# Patient Record
Sex: Female | Born: 1995 | Race: White | Hispanic: No | Marital: Single | Smoking: Current every day smoker
Health system: Southern US, Community
[De-identification: ages and names within clinical notes are randomized; demographics above are authoritative.]

## PROBLEM LIST (undated history)

## (undated) ENCOUNTER — Inpatient Hospital Stay: Payer: Self-pay

## (undated) HISTORY — PX: APPENDECTOMY: SHX54

---
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
## 2016-09-04 IMAGING — US US OB FOLLOW-UP
1 series · 13 of 28 positions shown · non-contrast
Comparison: none

CLINICAL DATA: 18-year-old at 21 weeks and 4 days by EDC of
09/09/2014. Evaluate anatomy.

EXAM:
OBSTETRIC 14+ WK ULTRASOUND FOLLOW-UP

[Series 1: us ob follow-up · 0.31mm/px · 13 of 48 slices shown]
[im 2/48]
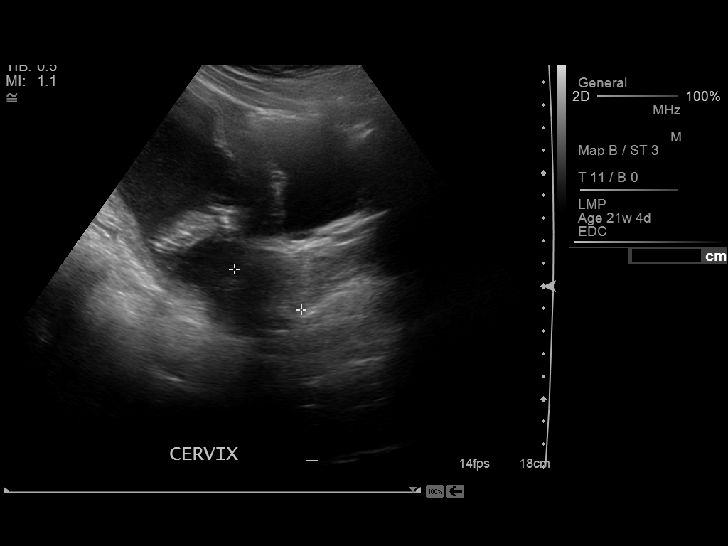
[im 6/48]
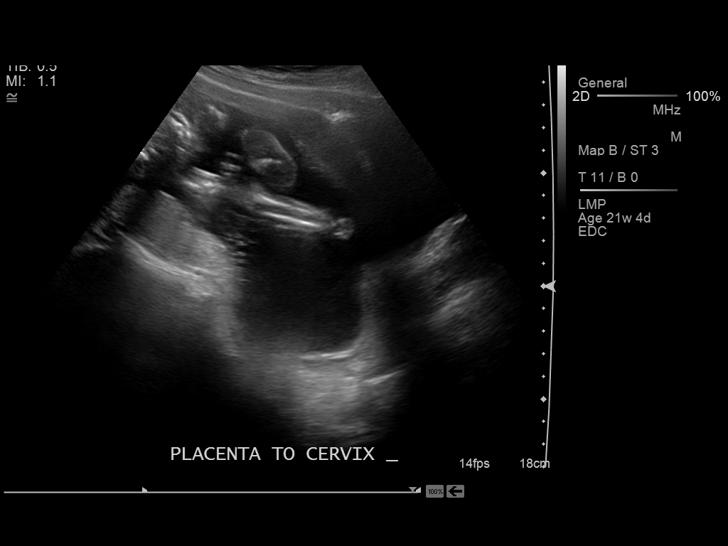
[im 9/48]
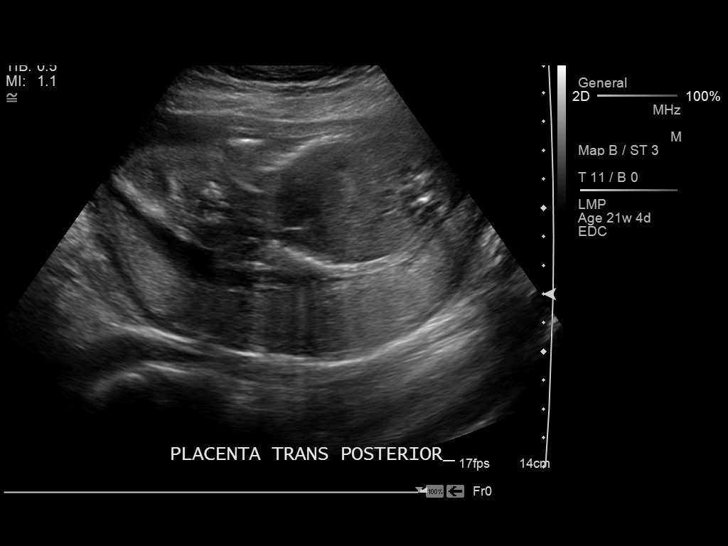
[im 13/48]
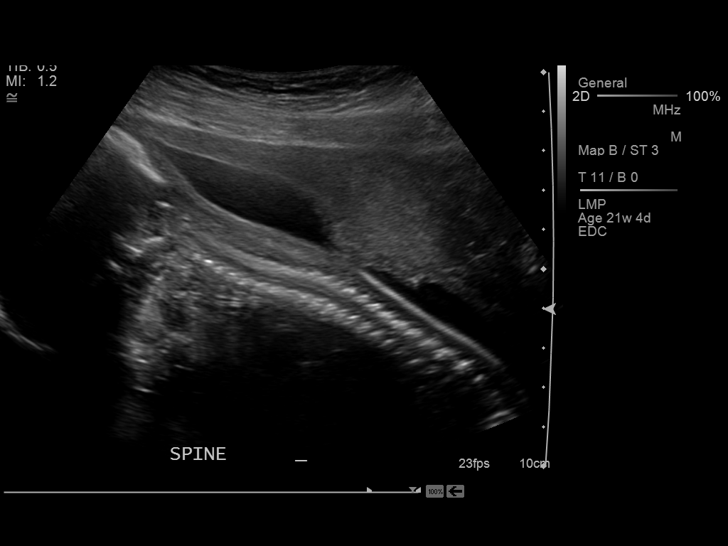
[im 16/48]
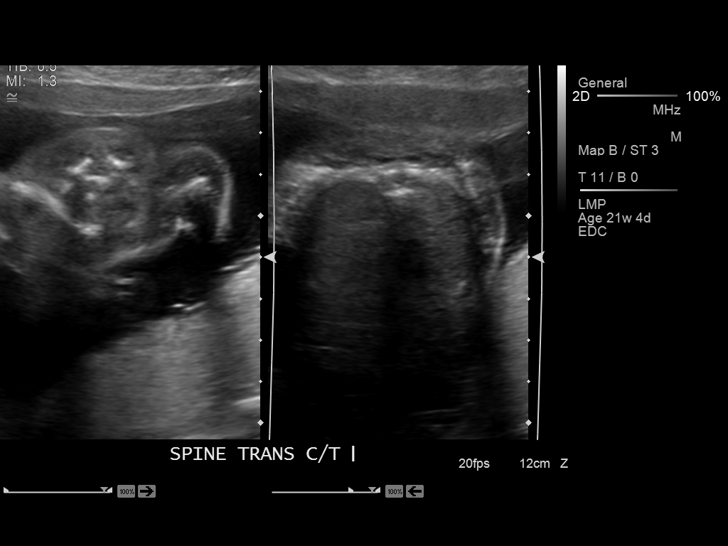
[im 20/48]
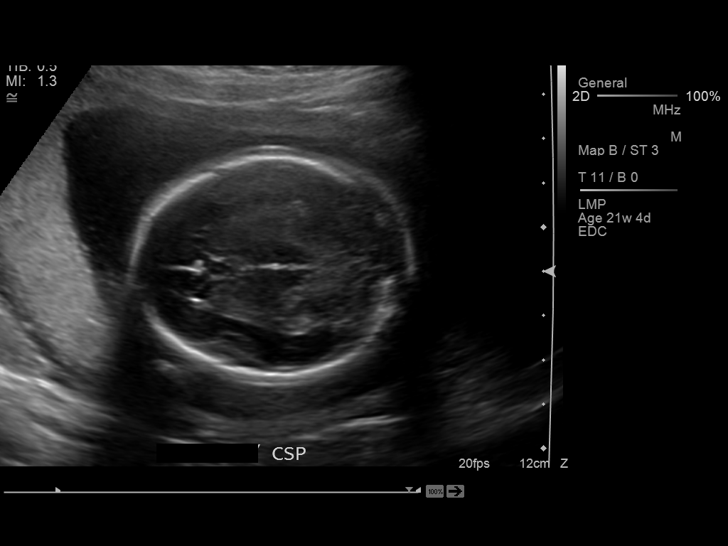
[im 25/48]
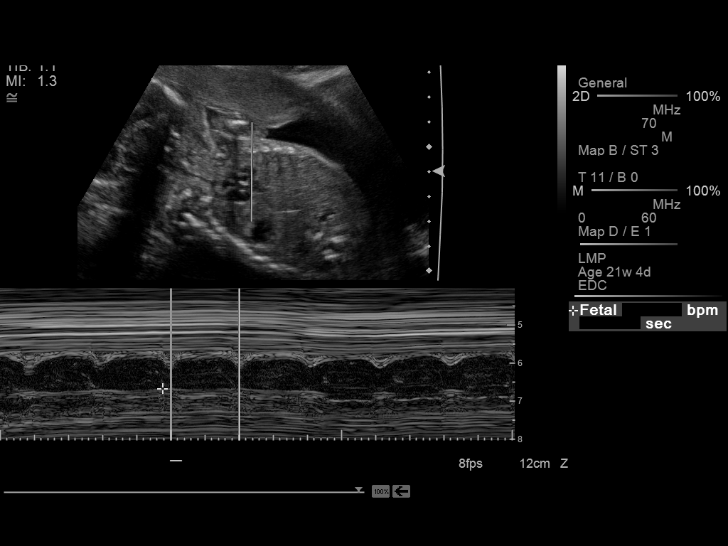
[im 28/48]
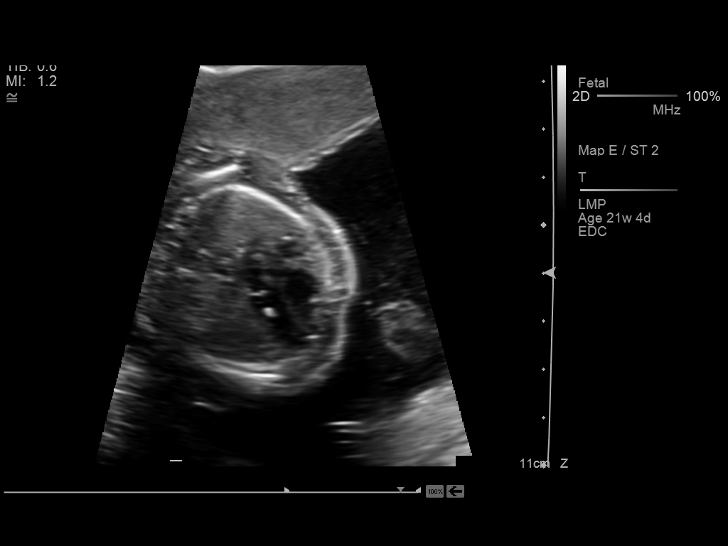
[im 32/48]
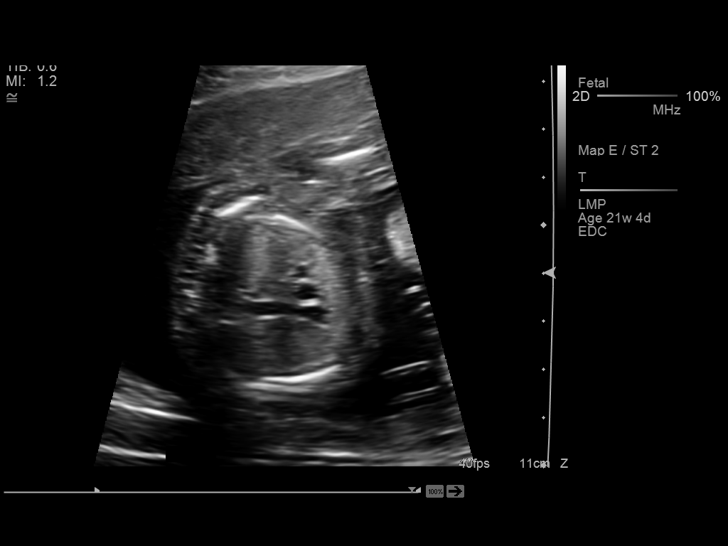
[im 35/48]
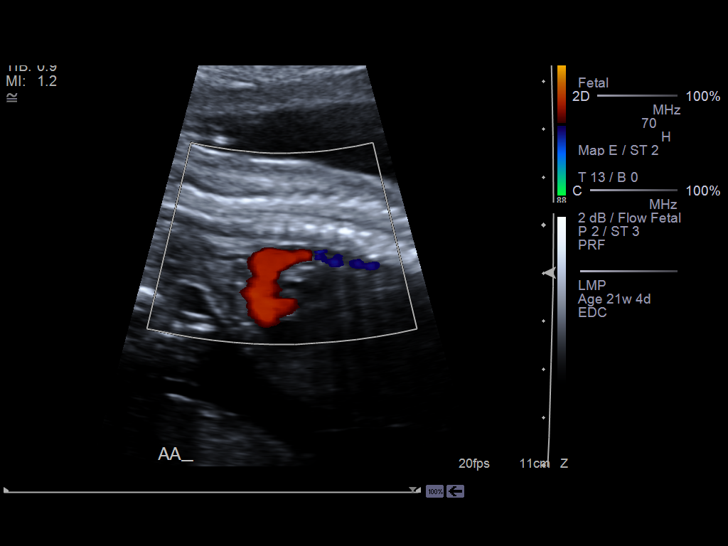
[im 39/48]
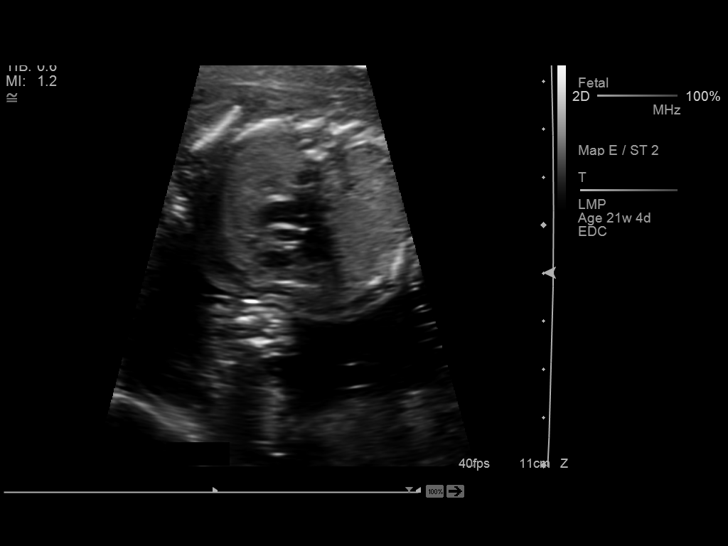
[im 42/48]
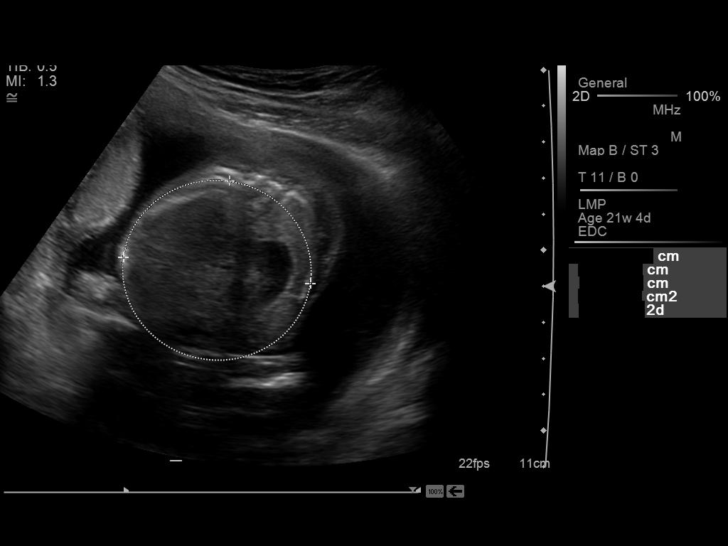
[im 46/48]
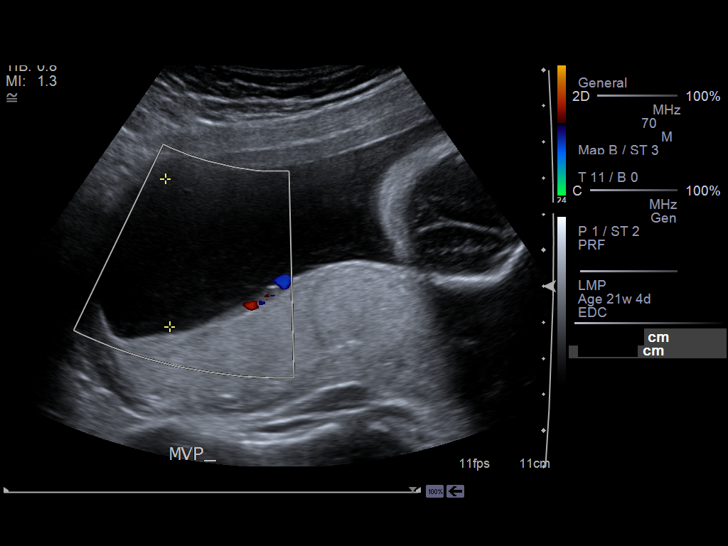

[13 of 28 positions shown; findings below may reference images not displayed]

FINDINGS: Number of Fetuses: 1

Heart Rate:  150 bpm

Movement: Yes

Presentation: Breech

Previa: No

Placental Location: Posterior

Amniotic Fluid (Subjective): Normal

Amniotic Fluid (Objective):

Vertical pocket 4.1cm

FETAL BIOMETRY

BPD:  4.88cm 20w   5d

HC:    18.28cm  20w   5d

AC:   15.67cm  20w   6d

FL:   3.422cm  20w   5d

Current Mean GA: 20w 6d          US EDC: 09/14/2014

Assigned GA:  21W 4d              Assigned EDC:  09/09/2014

FETAL ANATOMY

Lateral Ventricles: Visualized

Thalami/CSP: Visualized

Posterior Fossa:  Previously Visualized

Nuchal Region: Previously Visualized

Upper Lip: Previously Visualized

Spine: Visualized

4 Chamber Heart on Left: Visualized. Note is made of echogenic
intracardiac focus in the left ventricle.

LVOT: Visualized

RVOT: Visualized

Stomach on Left: Visualized

3 Vessel Cord: Previously visualized

Cord Insertion site: Previously visualized

Kidneys: Fistula

Bladder: Visual

Extremities: Previously visualized

Technically difficult due to: Fetal position

MATERNAL FINDINGS:

Cervix: 3.5 cm on transabdominal evaluation.
IMPRESSION: 1. Single living intrauterine fetus in breech presentation.
2. Size and dates correlate well.
3. There is an echogenic intracardiac focus in the left ventricle, a
soft marker for Down syndrome.
4. No other fetal anomalies are identified.

## 2016-10-29 ENCOUNTER — Ambulatory Visit
Admission: EM | Admit: 2016-10-29 | Discharge: 2016-10-29 | Disposition: A | Discharge status: Home / Self Care | Payer: Medicaid Other | Attending: Family Medicine | Admitting: Family Medicine

## 2016-10-29 ENCOUNTER — Encounter: Payer: Self-pay | Admitting: Emergency Medicine

## 2016-10-29 DIAGNOSIS — R109 Unspecified abdominal pain: Secondary | ICD-10-CM

## 2016-10-29 DIAGNOSIS — J020 Streptococcal pharyngitis: Secondary | ICD-10-CM

## 2016-10-29 LAB — RAPID STREP SCREEN (MED CTR MEBANE ONLY): Streptococcus, Group A Screen (Direct): POSITIVE — AB

## 2016-10-29 MED ORDER — PENICILLIN G BENZATHINE 1200000 UNIT/2ML IM SUSP
1.2000 10*6.[IU] | Freq: Once | INTRAMUSCULAR | Status: AC
  Administered 2016-10-29: 1.2 10*6.[IU] via INTRAMUSCULAR

## 2016-10-29 MED ORDER — ACETAMINOPHEN 500 MG PO TABS
1000.0000 mg | ORAL_TABLET | Freq: Once | ORAL | Status: AC
  Administered 2016-10-29: 1000 mg via ORAL

## 2016-10-29 MED ORDER — ACETAMINOPHEN 500 MG PO TABS: 500.0000 mg | ORAL_TABLET | Freq: Once | ORAL | Status: DC

## 2016-10-29 NOTE — ED Triage Notes (Signed)
Patient c/o sore throat, bodyaches, and stomach pain that started during last night.

## 2016-10-29 NOTE — ED Provider Notes (Addendum)
CSN: [id number]     Arrival date & time 10/29/16  1645 History   None    Chief Complaint  Patient presents with  . Generalized Body Aches  . Abdominal Pain  . Sore Throat   (Consider location/radiation/quality/duration/timing/severity/associated sxs/prior Treatment) HPI  This a 21 year old female who presents with sore throat body aches and stomach pain started last night. Patient states that she's had a sore throat. She had a cold with coughing a couple of weeks ago. She seemed to get over that except for some residual mucus that she would occasionally cough up. She has 2 children that [place] has [place] [employer] and also [place]. Neither of them are sick at this time. She has a fever of 101.4 pulse rate of 113 O2 sats of 100% blood pressure 126/90.       History reviewed. No pertinent past medical history. Past Surgical History:  Procedure Laterality Date  . APPENDECTOMY     Family History  Problem Relation Age of Onset  . Diabetes Mother   . Hepatitis C Mother   . Hepatitis C Father    Social History  Substance Use Topics  . Smoking status: Current Every Day Smoker  . Smokeless tobacco: Never Used  . Alcohol use No   OB History    Gravida Para Term Preterm AB Living   1 0       0   SAB TAB Ectopic Multiple Live Births                 Review of Systems  Constitutional: Positive for activity change, appetite change, chills, fatigue and fever.  HENT: Positive for sore throat.   Gastrointestinal: Positive for abdominal pain.  All other systems reviewed and are negative.   Allergies  Patient has no known allergies.  Home Medications   Prior to Admission medications   Not on File   Meds Ordered and Administered this Visit   Medications  acetaminophen (TYLENOL) tablet 1,000 mg (1,000 mg Oral Given 10/29/16 1727)  penicillin g benzathine (BICILLIN LA) 1200000 UNIT/2ML injection 1.2 Million Units (1.2 Million Units Intramuscular  Given 10/29/16 1819)    BP 126/90 (BP Location: Left Arm)   Pulse 85   Temp 99.4 F (37.4 C) (Oral)   Resp 16   Ht 5\' 1"  (1.549 m)   Wt 120 lb (54.4 kg)   LMP 10/15/2016 (Approximate)   SpO2 100%   BMI 22.67 kg/m  No data found.   Physical Exam  Constitutional: She is oriented to person, place, and time. She appears well-developed and well-nourished. No distress.  HENT:  Head: Normocephalic.  Right Ear: External ear normal.  Left Ear: External ear normal.  Mouth/Throat: No oropharyngeal exudate.  Examination of the right ear shows moderate burden of cerumen. The oropharynx shows the right tonsil to be inflamed swollen. There is a anterior cervical node palpable on the right. Left side appears normal. She has no exudate on the tonsils.  Eyes: Pupils are equal, round, and reactive to light. Right eye exhibits no discharge. Left eye exhibits no discharge.  Neck: Normal range of motion. No tracheal deviation present.  Pulmonary/Chest: Effort normal and breath sounds normal. No stridor.  Abdominal: Soft. Bowel sounds are normal. She exhibits no distension. There is tenderness. There is no rebound and no guarding.  Patient has mild diffuse tenderness of the abdomen.No Rebound no guarding present.  Musculoskeletal: Normal range of motion.  Lymphadenopathy:  She has cervical adenopathy.  Neurological: She is alert and oriented to person, place, and time.  Skin: Skin is warm and dry. She is not diaphoretic.  Psychiatric: She has a normal mood and affect. Her behavior is normal. Judgment and thought content normal.  Nursing note and vitals reviewed.   Urgent Care Course     Procedures (including critical care time)  Labs Review Labs Reviewed  RAPID STREP SCREEN (NOT AT [facility]) - Abnormal; Notable for the following:       Result Value   Streptococcus, Group A Screen (Direct) POSITIVE (*)    All other components within normal limits    Imaging Review No results  found.   Visual Acuity Review  Right Eye Distance:   Left Eye Distance:   Bilateral Distance:    Right Eye Near:   Left Eye Near:    Bilateral Near:     Medications  acetaminophen (TYLENOL) tablet 1,000 mg (1,000 mg Oral Given 10/29/16 1727)  penicillin g benzathine (BICILLIN LA) 1200000 UNIT/2ML injection 1.2 Million Units (1.2 Million Units Intramuscular Given 10/29/16 1819)      MDM   1. Strep pharyngitis    There are no discharge medications for this patient. Plan: 1. Test/x-ray results and diagnosis reviewed with patient 2. rx as per orders; risks, benefits, potential side effects reviewed with patient 3. Recommend supportive treatment with Rest and fluids. Be careful around the children until 24 hours after your medication injection.  If Your children shows signs / symptoms of illness take them to the pediatricians. Motrin as directed on bottle to help with fever chills and pain. If you not improving follow-up with her primary care physician. 4. F/u prn if symptoms worsen or don't improve     Lutricia FeilRoemer, Orly Quimby P, PA-C 10/29/16 1849    Lutricia FeilRoemer, Malayna Noori P, PA-C 10/29/16 1851

## 2016-11-27 ENCOUNTER — Ambulatory Visit
Admission: EM | Admit: 2016-11-27 | Discharge: 2016-11-27 | Disposition: A | Discharge status: Home / Self Care | Payer: Medicaid Other | Attending: Emergency Medicine | Admitting: Emergency Medicine

## 2016-11-27 ENCOUNTER — Encounter: Payer: Self-pay | Admitting: Emergency Medicine

## 2016-11-27 DIAGNOSIS — Z9109 Other allergy status, other than to drugs and biological substances: Secondary | ICD-10-CM | POA: Diagnosis not present

## 2016-11-27 DIAGNOSIS — F17210 Nicotine dependence, cigarettes, uncomplicated: Secondary | ICD-10-CM | POA: Insufficient documentation

## 2016-11-27 DIAGNOSIS — B9789 Other viral agents as the cause of diseases classified elsewhere: Secondary | ICD-10-CM

## 2016-11-27 DIAGNOSIS — J3489 Other specified disorders of nose and nasal sinuses: Secondary | ICD-10-CM | POA: Insufficient documentation

## 2016-11-27 DIAGNOSIS — R05 Cough: Secondary | ICD-10-CM | POA: Insufficient documentation

## 2016-11-27 DIAGNOSIS — Z79899 Other long term (current) drug therapy: Secondary | ICD-10-CM | POA: Insufficient documentation

## 2016-11-27 DIAGNOSIS — R079 Chest pain, unspecified: Secondary | ICD-10-CM | POA: Insufficient documentation

## 2016-11-27 DIAGNOSIS — J069 Acute upper respiratory infection, unspecified: Secondary | ICD-10-CM

## 2016-11-27 DIAGNOSIS — K219 Gastro-esophageal reflux disease without esophagitis: Secondary | ICD-10-CM | POA: Insufficient documentation

## 2016-11-27 DIAGNOSIS — D649 Anemia, unspecified: Secondary | ICD-10-CM | POA: Diagnosis not present

## 2016-11-27 DIAGNOSIS — R0981 Nasal congestion: Secondary | ICD-10-CM | POA: Diagnosis present

## 2016-11-27 DIAGNOSIS — R0982 Postnasal drip: Secondary | ICD-10-CM | POA: Diagnosis not present

## 2016-11-27 DIAGNOSIS — R062 Wheezing: Secondary | ICD-10-CM | POA: Insufficient documentation

## 2016-11-27 LAB — PREGNANCY, URINE: Preg Test, Ur: NEGATIVE

## 2016-11-27 MED ORDER — FLUTICASONE PROPIONATE 50 MCG/ACT NA SUSP: 2.0000 | Freq: Every day | NASAL | 0 refills | Status: DC

## 2016-11-27 MED ORDER — BENZONATATE 200 MG PO CAPS: 200.0000 mg | ORAL_CAPSULE | Freq: Three times a day (TID) | ORAL | 0 refills | Status: DC | PRN

## 2016-11-27 NOTE — ED Triage Notes (Signed)
Patient c/o cough and chest congestion that started yesterday.  Patient denies fevers.  

## 2016-11-27 NOTE — ED Provider Notes (Signed)
HPI  SUBJECTIVE:  Brittany Krueger is a 21 y.o. female who presents with greenish nasal congestion, rhinorrhea, postnasal drip starting 3-4 days ago. She reports sore, scratchy itchy throat that makes her cough and a cough productive of greenish mucus the same as her nasal congestion starting last night. She reports burning chest pain with the cough. She reports wheezing that clears with coughing, chest soreness. States that she was unable to sleep last night secondary to the cough. She reports sneezing, but no itchy, watery eyes. There are no aggravating or alleviating factors. She has not tried anything for this. Her father has identical symptoms currently and her 2 young children are sick as well. She denies ear pain, sinus pain and pressure, fevers, shortness of breath, shortness of breath with exertion. No antibiotics in the past month. No antipyretic in the past 6-8 hours. She is a smoker. She has a history of GERD, seasonal allergies, anemia. No history of asthma, emphysema, COPD, diabetes, hypertension. LMP: Regular. [id number], Adelina Mings, PA-C   History reviewed. No pertinent past medical history.  Past Surgical History:  Procedure Laterality Date  . APPENDECTOMY      Family History  Problem Relation Age of Onset  . Diabetes Mother   . Hepatitis C Mother   . Hepatitis C Father     Social History  Substance Use Topics  . Smoking status: Current Every Day Smoker    Types: Cigarettes  . Smokeless tobacco: Never Used  . Alcohol use No    No current facility-administered medications for this encounter.   Current Outpatient Prescriptions:  .  benzonatate (TESSALON) 200 MG capsule, Take 1 capsule (200 mg total) by mouth 3 (three) times daily as needed for cough., Disp: 30 capsule, Rfl: 0 .  fluticasone (FLONASE) 50 MCG/ACT nasal spray, Place 2 sprays into both nostrils daily., Disp: 16 g, Rfl: 0  No Known Allergies   ROS  As noted in HPI.   Physical Exam  BP 111/87 (BP Location:  Left Arm)   Pulse 93   Temp 98.5 F (36.9 C) (Oral)   Resp 14   Ht 5\' 1"  (1.549 m)   Wt 125 lb (56.7 kg)   SpO2 100%   BMI 23.62 kg/m   Constitutional: Well developed, well nourished, no acute distress Eyes:  EOMI, conjunctiva normal bilaterally HENT: Normocephalic, atraumatic,mucus membranes moistPositive clear rhinorrhea erythematous, swollen turbinates on the right more so than the left. No sinus tenderness. Normal oropharynx. No cobblestoning. Positive postnasal drip. Respiratory: Normal inspiratory effort lungs clear bilaterally, good air movement. No chest wall tenderness Cardiovascular: Normal rate regular rhythm no murmurs rubs gallops  GI: nondistended skin: No rash, skin intact Musculoskeletal: no deformities Neurologic: Alert & oriented x 3, no focal neuro deficits Psychiatric: Speech and behavior appropriate   ED Course   Medications - No data to display  Orders Placed This Encounter  Procedures  . Pregnancy, urine    Standing Status:   Standing    Number of Occurrences:   1    Results for orders placed or performed during the hospital encounter of 11/27/16 (from the past 24 hour(s))  Pregnancy, urine     Status: None   Collection Time: 11/27/16  5:35 PM  Result Value Ref Range   Preg Test, Ur NEGATIVE NEGATIVE   No results found.  ED Clinical Impression  Viral URI with cough   ED Assessment/Plan   Urine pregnancy negative.  Presentation consistent with viral URI. Feel of the cough is mostly  from postnasal drip. We'll send home with Tessalon, patient states that she does not want a prescription for cough syrup as it makes her vomit, Flonase, Mucinex D, advised saline nasal irrigation. Advised her that the cough may last for several weeks. She'll follow-up with her primary care physician as needed.   Meds ordered this encounter  Medications  . fluticasone (FLONASE) 50 MCG/ACT nasal spray    Sig: Place 2 sprays into both nostrils daily.     Dispense:  16 g    Refill:  0  . benzonatate (TESSALON) 200 MG capsule    Sig: Take 1 capsule (200 mg total) by mouth 3 (three) times daily as needed for cough.    Dispense:  30 capsule    Refill:  0    *This clinic note was created using Scientist, clinical (histocompatibility and immunogenetics)Dragon dictation software. Therefore, there may be occasional mistakes despite careful proofreading.  ?   Domenick GongMortenson, Kyliee Ortego, MD 11/27/16 1816

## 2016-11-27 NOTE — Discharge Instructions (Signed)
Tessalon for cough, saline nasal irrigation to help prevent sinus infection. use a Lloyd Hugereil med sinus rinse, flonase for nasal congestion. Start some mucinex D, The cough may last for several weeks, but you should be feeling better overall.  Give this a week to 10 days at least.

## 2017-01-13 ENCOUNTER — Emergency Department
Admission: EM | Admit: 2017-01-13 | Discharge: 2017-01-13 | Discharge status: Against Medical Advice | Payer: Medicaid Other | Attending: Emergency Medicine | Admitting: Emergency Medicine

## 2017-01-13 ENCOUNTER — Emergency Department: Payer: Medicaid Other

## 2017-01-13 DIAGNOSIS — F17210 Nicotine dependence, cigarettes, uncomplicated: Secondary | ICD-10-CM | POA: Diagnosis not present

## 2017-01-13 DIAGNOSIS — Z79899 Other long term (current) drug therapy: Secondary | ICD-10-CM | POA: Diagnosis not present

## 2017-01-13 DIAGNOSIS — O99331 Smoking (tobacco) complicating pregnancy, first trimester: Secondary | ICD-10-CM | POA: Diagnosis not present

## 2017-01-13 DIAGNOSIS — O26899 Other specified pregnancy related conditions, unspecified trimester: Secondary | ICD-10-CM

## 2017-01-13 DIAGNOSIS — R102 Pelvic and perineal pain: Secondary | ICD-10-CM | POA: Diagnosis not present

## 2017-01-13 DIAGNOSIS — R109 Unspecified abdominal pain: Secondary | ICD-10-CM

## 2017-01-13 DIAGNOSIS — O26891 Other specified pregnancy related conditions, first trimester: Secondary | ICD-10-CM | POA: Diagnosis present

## 2017-01-13 LAB — CBC
HEMATOCRIT: 38.7 % (ref 35.0–47.0)
HEMOGLOBIN: 13 g/dL (ref 12.0–16.0)
MCH: 29.3 pg (ref 26.0–34.0)
MCHC: 33.7 g/dL (ref 32.0–36.0)
MCV: 87.1 fL (ref 80.0–100.0)
Platelets: 206 10*3/uL (ref 150–440)
RBC: 4.44 MIL/uL (ref 3.80–5.20)
RDW: 13.9 % (ref 11.5–14.5)
WBC: 7.2 10*3/uL (ref 3.6–11.0)

## 2017-01-13 LAB — URINALYSIS, COMPLETE (UACMP) WITH MICROSCOPIC
Bilirubin Urine: NEGATIVE
Glucose, UA: NEGATIVE mg/dL
Hgb urine dipstick: NEGATIVE
Ketones, ur: NEGATIVE mg/dL
LEUKOCYTES UA: NEGATIVE
NITRITE: NEGATIVE
Protein, ur: NEGATIVE mg/dL
SPECIFIC GRAVITY, URINE: 1.019 (ref 1.005–1.030)
pH: 5 (ref 5.0–8.0)

## 2017-01-13 LAB — COMPREHENSIVE METABOLIC PANEL
ALBUMIN: 3.9 g/dL (ref 3.5–5.0)
ALK PHOS: 57 U/L (ref 38–126)
ALT: 8 U/L — ABNORMAL LOW (ref 14–54)
AST: 13 U/L — ABNORMAL LOW (ref 15–41)
Anion gap: 7 (ref 5–15)
BILIRUBIN TOTAL: 0.5 mg/dL (ref 0.3–1.2)
BUN: 11 mg/dL (ref 6–20)
CALCIUM: 8.9 mg/dL (ref 8.9–10.3)
CO2: 24 mmol/L (ref 22–32)
Chloride: 104 mmol/L (ref 101–111)
Creatinine, Ser: 0.81 mg/dL (ref 0.44–1.00)
GFR calc non Af Amer: >60 mL/min (ref >60)
GLUCOSE: 88 mg/dL (ref 65–99)
POTASSIUM: 4.1 mmol/L (ref 3.5–5.1)
SODIUM: 135 mmol/L (ref 135–145)
TOTAL PROTEIN: 7.8 g/dL (ref 6.5–8.1)

## 2017-01-13 LAB — CHLAMYDIA/NGC RT PCR (ARMC ONLY)
CHLAMYDIA TR: NOT DETECTED
N gonorrhoeae: NOT DETECTED

## 2017-01-13 LAB — WET PREP, GENITAL
Sperm: NONE SEEN
TRICH WET PREP: NONE SEEN
Yeast Wet Prep HPF POC: NONE SEEN

## 2017-01-13 LAB — POCT PREGNANCY, URINE: PREG TEST UR: POSITIVE — AB

## 2017-01-13 LAB — PREGNANCY, URINE: Preg Test, Ur: POSITIVE — AB

## 2017-01-13 LAB — LIPASE, BLOOD: Lipase: 32 U/L (ref 11–51)

## 2017-01-13 LAB — HCG, QUANTITATIVE, PREGNANCY: HCG, BETA CHAIN, QUANT, S: 11867 m[IU]/mL — AB (ref <5)

## 2017-01-13 MED ORDER — PRENATAL VITAMINS 0.8 MG PO TABS: 1.0000 | ORAL_TABLET | Freq: Every day | ORAL | 0 refills | Status: DC

## 2017-01-13 NOTE — ED Provider Notes (Addendum)
[facility] Emergency Department Provider Note  ____________________________________________   First MD Initiated Contact with Patient 01/13/17 2015     (approximate)  I have reviewed the triage vital signs and the nursing notes.   HISTORY  Chief Complaint Abdominal Pain   HPI Brittany Krueger is a 21 y.o. female presenting to the emergency department approximately [redacted] weeks pregnant with lower abdominal pain over the past 3 days. She denies any bleeding but does complain of discharge. Says that she is just had a child 11 months ago and has been on off of Depakote since then with bleeding issues and has had irregular periods. For this reason she has not sought treatment for not having a period over the last 3 months because of these irregular periods. However, she is concerned about drinking several times in the last several weeks and this causing pain to her lower abdomen as a friend has told her. She does not report any nausea or vomiting. Does not report any burning with urination or frequency.   No past medical history on file.  Patient Active Problem List   Diagnosis Date Noted  . Abdominal pain affecting pregnancy 08/02/2014    Past Surgical History:  Procedure Laterality Date  . APPENDECTOMY      Prior to Admission medications   Medication Sig Start Date End Date Taking? Authorizing Provider  benzonatate (TESSALON) 200 MG capsule Take 1 capsule (200 mg total) by mouth 3 (three) times daily as needed for cough. 11/27/16   Domenick Gong, MD  fluticasone (FLONASE) 50 MCG/ACT nasal spray Place 2 sprays into both nostrils daily. 11/27/16   Domenick Gong, MD    Allergies Patient has no known allergies.  Family History  Problem Relation Age of Onset  . Diabetes Mother   . Hepatitis C Mother   . Hepatitis C Father     Social History Social History  Substance Use Topics  . Smoking status: Current Every Day Smoker    Types: Cigarettes  .  Smokeless tobacco: Never Used  . Alcohol use No    Review of Systems  Constitutional: No fever/chills Eyes: No visual changes. ENT: No sore throat. Cardiovascular: Denies chest pain. Respiratory: Denies shortness of breath. Gastrointestinal:  No nausea, no vomiting.  No diarrhea.  No constipation. Genitourinary: Negative for dysuria. Musculoskeletal: mild left lower back pain. Skin: Negative for rash. Neurological: Negative for headaches, focal weakness or numbness.   ____________________________________________   PHYSICAL EXAM:  VITAL SIGNS: ED Triage Vitals  Enc Vitals Group     BP 01/13/17 1901 125/71     Pulse Rate 01/13/17 1901 89     Resp 01/13/17 1901 20     Temp 01/13/17 1901 98.5 F (36.9 C)     Temp Source 01/13/17 1901 Oral     SpO2 01/13/17 1901 100 %     Weight 01/13/17 1900 120 lb (54.4 kg)     Height 01/13/17 1900  (1.549 m)     Head Circumference --      Peak Flow --      Pain Score 01/13/17 1859 3     Pain Loc --      Pain Edu? --      Excl. in GC? --     Constitutional: Alert and oriented. Well appearing and in no acute distress. Eyes: Conjunctivae are normal.  Head: Atraumatic. Nose: No congestion/rhinnorhea. Mouth/Throat: Mucous membranes are moist.  Neck: No stridor.   Cardiovascular: Normal rate, regular rhythm. Grossly  normal heart sounds.   Respiratory: Normal respiratory effort.  No retractions. Lungs CTAB. Gastrointestinal: Soft with mild right lower quadrant as well as suprapubic tenderness with mild to moderate tenderness to palpation to left lower quadrant without rebound or guarding. No distention. No CVA tenderness. Genitourinary:  normal external appearance without any lesions. Speculum exam with a very small amount of white to clear mucus-like discharge. Bimanual exam without any CMT. The os is closed. No uterine nor adnexal tenderness nor masses palpable. Musculoskeletal: No lower extremity tenderness nor edema.  No joint  effusions. Neurologic:  Normal speech and language. No gross focal neurologic deficits are appreciated. Skin:  Skin is warm, dry and intact. No rash noted. Psychiatric: Mood and affect are normal. Speech and behavior are normal.  ____________________________________________   LABS (all labs ordered are listed, but only abnormal results are displayed)  Labs Reviewed  WET PREP, GENITAL - Abnormal; Notable for the following:       Result Value   Clue Cells Wet Prep HPF POC PRESENT (*)    WBC, Wet Prep HPF POC MODERATE (*)    All other components within normal limits  COMPREHENSIVE METABOLIC PANEL - Abnormal; Notable for the following:    AST 13 (*)    ALT 8 (*)    All other components within normal limits  URINALYSIS, COMPLETE (UACMP) WITH MICROSCOPIC - Abnormal; Notable for the following:    Color, Urine YELLOW (*)    APPearance CLEAR (*)    Bacteria, UA RARE (*)    Squamous Epithelial / LPF 0-5 (*)    All other components within normal limits  PREGNANCY, URINE - Abnormal; Notable for the following:    Preg Test, Ur POSITIVE (*)    All other components within normal limits  POCT PREGNANCY, URINE - Abnormal; Notable for the following:    Preg Test, Ur POSITIVE (*)    All other components within normal limits  CHLAMYDIA/NGC RT PCR ([facility] ONLY)  LIPASE, BLOOD  CBC  HCG, QUANTITATIVE, PREGNANCY  POC URINE PREG, ED   ____________________________________________  EKG   ____________________________________________  RADIOLOGY  subchronic hemorrhage. Possible early intrauterine gestational sac. ____________________________________________   PROCEDURES  Procedure(s) performed:   Procedures  Critical Care performed:   ____________________________________________   INITIAL IMPRESSION / ASSESSMENT AND PLAN / ED COURSE  Pertinent labs & imaging results that were available during my care of the patient were reviewed by me and considered in my medical decision  making (see chart for details).  Differential diagnosis includes, but is not limited to, threatened miscarriage, incomplete miscarriage, normal bleeding from an early trimester pregnancy, ectopic pregnancy, , blighted ovum, vaginal/cervical trauma, subchorionic hemorrhage/hematoma, etc.  As part of my medical decision making, I reviewed the following data within the electronic MEDICAL RECORD NUMBER Notes from prior ED visits  ----------------------------------------- 11:10 PM on 01/13/2017 -----------------------------------------  Patient wanting to leave prior to her hCG being resulted. Possible early gestational sac but without confirmed intrauterine pregnancy.right-sided cyst which is on the opposite side of her pain. I discussed the possible ramifications of leaving prior to the patient's full workup being completed. She understands the risks of ectopic pregnancy including death, surgery and serious medical complications. She has capacity and is not clinically intoxicated. She says she has to leave at this time. She'll be following up at the [facility] clinic in 2 days and can return to the emergency department at any time for any worsening or returning symptoms. She is understanding of this plan and  willing to comply.      ____________________________________________   FINAL CLINICAL IMPRESSION(S) / ED DIAGNOSES  Final diagnoses:  Abdominal pain during pregnancy, antepartum   pregnancy of unknown origin. Subchorionic hemorrhage. Abdominal pain of pregnancy.   NEW MEDICATIONS STARTED DURING THIS VISIT:  New Prescriptions   No medications on file     Note:  This document was prepared using Dragon voice recognition software and may include unintentional dictation errors.     Myrna Blazer, MD 01/13/17 2311  be positive blood type    Myrna Blazer, MD 01/13/17 [phone]

## 2017-01-13 NOTE — ED Notes (Signed)
Pt states she needs to leave. Pending HCG from lab.

## 2017-01-13 NOTE — ED Notes (Signed)
Called lab and notified of HCG add-on.

## 2017-01-13 NOTE — ED Notes (Signed)
MD speaking with pt regarding d/c prior to HCG levels resulting bc pt need to leave. D/c paperwork not typed at this time but verbal agreement with MD regarding followup.

## 2017-01-13 NOTE — ED Triage Notes (Signed)
Patient reports having irregular menstrual cycle since having baby 11 months ago, lmp 3 months ago.  Reports taking home pregnancy test with positive results.  Reports on Friday had a panic attack and since then has been having abdominal cramping.

## 2017-01-14 LAB — POCT PREGNANCY, URINE: PREG TEST UR: POSITIVE — AB

## 2017-03-05 ENCOUNTER — Other Ambulatory Visit: Payer: Self-pay

## 2017-03-05 ENCOUNTER — Ambulatory Visit
Admission: EM | Admit: 2017-03-05 | Discharge: 2017-03-05 | Disposition: A | Discharge status: Home / Self Care | Payer: Medicaid Other | Attending: Family Medicine | Admitting: Family Medicine

## 2017-03-05 ENCOUNTER — Encounter: Payer: Self-pay | Admitting: Emergency Medicine

## 2017-03-05 DIAGNOSIS — N939 Abnormal uterine and vaginal bleeding, unspecified: Secondary | ICD-10-CM

## 2017-03-05 DIAGNOSIS — Z91410 Personal history of adult physical and sexual abuse: Secondary | ICD-10-CM | POA: Diagnosis not present

## 2017-03-05 DIAGNOSIS — R6883 Chills (without fever): Secondary | ICD-10-CM | POA: Insufficient documentation

## 2017-03-05 DIAGNOSIS — J020 Streptococcal pharyngitis: Secondary | ICD-10-CM | POA: Insufficient documentation

## 2017-03-05 DIAGNOSIS — R52 Pain, unspecified: Secondary | ICD-10-CM | POA: Diagnosis present

## 2017-03-05 DIAGNOSIS — J029 Acute pharyngitis, unspecified: Secondary | ICD-10-CM | POA: Diagnosis present

## 2017-03-05 LAB — CBC WITH DIFFERENTIAL/PLATELET
BASOS PCT: 1 %
Basophils Absolute: 0.1 10*3/uL (ref 0–0.1)
Eosinophils Absolute: 0 10*3/uL (ref 0–0.7)
Eosinophils Relative: 0 %
HEMATOCRIT: 37.7 % (ref 35.0–47.0)
HEMOGLOBIN: 12.7 g/dL (ref 12.0–16.0)
Lymphocytes Relative: 7 %
Lymphs Abs: 0.8 10*3/uL — ABNORMAL LOW (ref 1.0–3.6)
MCH: 28.5 pg (ref 26.0–34.0)
MCHC: 33.6 g/dL (ref 32.0–36.0)
MCV: 85 fL (ref 80.0–100.0)
MONOS PCT: 8 %
Monocytes Absolute: 0.9 10*3/uL (ref 0.2–0.9)
NEUTROS ABS: 10 10*3/uL — AB (ref 1.4–6.5)
NEUTROS PCT: 84 %
Platelets: 225 10*3/uL (ref 150–440)
RBC: 4.44 MIL/uL (ref 3.80–5.20)
RDW: 13.2 % (ref 11.5–14.5)
WBC: 11.8 10*3/uL — ABNORMAL HIGH (ref 3.6–11.0)

## 2017-03-05 LAB — RAPID INFLUENZA A&B ANTIGENS (ARMC ONLY)
INFLUENZA A (ARMC): NEGATIVE
INFLUENZA B (ARMC): NEGATIVE

## 2017-03-05 LAB — RAPID STREP SCREEN (MED CTR MEBANE ONLY): STREPTOCOCCUS, GROUP A SCREEN (DIRECT): POSITIVE — AB

## 2017-03-05 MED ORDER — ACETAMINOPHEN 500 MG PO TABS
1000.0000 mg | ORAL_TABLET | Freq: Once | ORAL | Status: AC
  Administered 2017-03-05: 1000 mg via ORAL

## 2017-03-05 MED ORDER — PENICILLIN G BENZATHINE 1200000 UNIT/2ML IM SUSP
1.2000 10*6.[IU] | Freq: Once | INTRAMUSCULAR | Status: AC
  Administered 2017-03-05: 1.2 10*6.[IU] via INTRAMUSCULAR
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
## 2017-03-05 NOTE — ED Triage Notes (Signed)
Patient c/o sore throat, HAs, and nausea that started yesterday.

## 2017-03-05 NOTE — ED Notes (Signed)
Patient shows no signs of adverse reaction to medication at this time.  

## 2018-05-01 ENCOUNTER — Other Ambulatory Visit: Payer: Self-pay

## 2018-05-01 ENCOUNTER — Ambulatory Visit
Admission: EM | Admit: 2018-05-01 | Discharge: 2018-05-01 | Disposition: A | Discharge status: Home / Self Care | Payer: Medicaid Other | Attending: Family Medicine | Admitting: Family Medicine

## 2018-05-01 DIAGNOSIS — J020 Streptococcal pharyngitis: Secondary | ICD-10-CM

## 2018-05-01 LAB — RAPID STREP SCREEN (MED CTR MEBANE ONLY): STREPTOCOCCUS, GROUP A SCREEN (DIRECT): POSITIVE — AB

## 2018-05-01 MED ORDER — DEXAMETHASONE SODIUM PHOSPHATE 10 MG/ML IJ SOLN
10.0000 mg | Freq: Once | INTRAMUSCULAR | Status: AC
  Administered 2018-05-01: 10 mg via INTRAMUSCULAR

## 2018-05-01 MED ORDER — PENICILLIN G BENZATHINE 1200000 UNIT/2ML IM SUSP
1.2000 10*6.[IU] | Freq: Once | INTRAMUSCULAR | Status: AC
  Administered 2018-05-01: 1.2 10*6.[IU] via INTRAMUSCULAR

## 2018-05-01 NOTE — ED Provider Notes (Signed)
[facility]    CSN: [id number] Arrival date & time: 05/01/18  1619  History   Chief Complaint Chief Complaint  Patient presents with  . Sore Throat  . Otalgia   HPI  23 year old female presents with sore throat and ear pain.  3-day history of sore throat and ear pain.  Sore throat is severe.  No documented temperature at home.  She has an elevated temperature here.  Patient states that her sore throat is so severe that is interfering with her being able to swallow.  She has tried over-the-counter cold medication without resolution.  Sore throat exacerbated by swallowing.  No relieving factors.  No other associated symptoms.  No other complaints.  PMH, Surgical Hx, Family Hx, Social History reviewed and updated as below.  PMH:  Chronic shoulder pain Asthma  Past Surgical History:  Procedure Laterality Date  . APPENDECTOMY     OB History    Gravida  1   Para  0   Term      Preterm      AB      Living  0     SAB      TAB      Ectopic      Multiple      Live Births             Home Medications    Prior to Admission medications   Not on File   Family History Family History  Problem Relation Age of Onset  . Diabetes Mother   . Hepatitis C Mother   . Hepatitis C Father     Social History Social History   Tobacco Use  . Smoking status: Current Every Day Smoker    Packs/day: 0.50    Types: Cigarettes  . Smokeless tobacco: Never Used  Substance Use Topics  . Alcohol use: Yes    Comment: occasional  . Drug use: No     Allergies   Patient has no known allergies.   Review of Systems Review of Systems  Constitutional:       No fever at home.  HENT: Positive for ear pain, sore throat and trouble swallowing.    Physical Exam Triage Vital Signs ED Triage Vitals  Enc Vitals Group     BP 05/01/18 1642 119/65     Pulse Rate 05/01/18 1642 79     Resp 05/01/18 1642 16     Temp 05/01/18 1642 100 F (37.8 C)     Temp Source  05/01/18 1642 Oral     SpO2 05/01/18 1642 100 %     Weight 05/01/18 1643 135 lb (61.2 kg)     Height 05/01/18 1643 5\' 1"  (1.549 m)     Head Circumference --      Peak Flow --      Pain Score 05/01/18 1643 7     Pain Loc --      Pain Edu? --      Excl. in GC? --    Updated Vital Signs BP 119/65 (BP Location: Left Arm)   Pulse 79   Temp 100 F (37.8 C) (Oral)   Resp 16   Ht 5\' 1"  (1.549 m)   Wt 61.2 kg   LMP 04/20/2018   SpO2 100%   BMI 25.51 kg/m   Visual Acuity Right Eye Distance:   Left Eye Distance:   Bilateral Distance:    Right Eye Near:   Left Eye Near:    Bilateral Near:  Physical Exam Vitals signs and nursing note reviewed.  Constitutional:      General: She is not in acute distress.    Appearance: She is ill-appearing.  HENT:     Head: Normocephalic and atraumatic.     Right Ear: Tympanic membrane normal.     Left Ear: Tympanic membrane normal.     Mouth/Throat:     Pharynx: Uvula midline. Pharyngeal swelling and posterior oropharyngeal erythema present.     Tonsils: Swelling: 2+ on the right. 2+ on the left.  Eyes:     General:        Right eye: No discharge.        Left eye: No discharge.     Conjunctiva/sclera: Conjunctivae normal.  Cardiovascular:     Rate and Rhythm: Normal rate and regular rhythm.  Pulmonary:     Effort: Pulmonary effort is normal.     Breath sounds: Normal breath sounds.  Neurological:     Mental Status: She is alert.  Psychiatric:        Behavior: Behavior normal.     Comments: Flat affect.    UC Treatments / Results  Labs (all labs ordered are listed, but only abnormal results are displayed) Labs Reviewed  RAPID STREP SCREEN ([facility] ONLY) - Abnormal; Notable for the following components:      Result Value   Streptococcus, Group A Screen (Direct) POSITIVE (*)    All other components within normal limits    EKG None  Radiology No results found.  Procedures Procedures (including critical care  time)  Medications Ordered in UC Medications  penicillin g benzathine (BICILLIN LA) 1200000 UNIT/2ML injection 1.2 Million Units (1.2 Million Units Intramuscular Given 05/01/18 1721)  dexamethasone (DECADRON) injection 10 mg (10 mg Intramuscular Given 05/01/18 1721)    Initial Impression / Assessment and Plan / UC Course  I have reviewed the triage vital signs and the nursing notes.  Pertinent labs & imaging results that were available during my care of the patient were reviewed by me and considered in my medical decision making (see chart for details).    23 year old female presents with strep pharyngitis.  Patient very symptomatic and feels poorly.  IM injection of penicillin given.  Patient also elected to have an injection of corticosteroid to help with the pain.  Final Clinical Impressions(s) / UC Diagnoses   Final diagnoses:  Strep pharyngitis   Discharge Instructions   None    ED Prescriptions    None     Controlled Substance Prescriptions [place] Controlled Substance Registry consulted? Not Applicable   Tommie Sams, DO 05/01/18 1806

## 2018-05-01 NOTE — ED Triage Notes (Signed)
Pt with sore throat, bilateral otalgia, low grade fever

## 2018-09-15 ENCOUNTER — Other Ambulatory Visit: Payer: Self-pay

## 2018-09-15 ENCOUNTER — Encounter: Payer: Self-pay | Admitting: Emergency Medicine

## 2018-09-15 DIAGNOSIS — S0990XA Unspecified injury of head, initial encounter: Secondary | ICD-10-CM | POA: Insufficient documentation

## 2018-09-15 DIAGNOSIS — F17210 Nicotine dependence, cigarettes, uncomplicated: Secondary | ICD-10-CM | POA: Insufficient documentation

## 2018-09-15 DIAGNOSIS — Y999 Unspecified external cause status: Secondary | ICD-10-CM | POA: Insufficient documentation

## 2018-09-15 DIAGNOSIS — S060X0A Concussion without loss of consciousness, initial encounter: Secondary | ICD-10-CM | POA: Insufficient documentation

## 2018-09-15 DIAGNOSIS — Y9389 Activity, other specified: Secondary | ICD-10-CM | POA: Insufficient documentation

## 2018-09-15 DIAGNOSIS — R55 Syncope and collapse: Secondary | ICD-10-CM | POA: Insufficient documentation

## 2018-09-15 DIAGNOSIS — R531 Weakness: Secondary | ICD-10-CM | POA: Insufficient documentation

## 2018-09-15 DIAGNOSIS — Y92410 Unspecified street and highway as the place of occurrence of the external cause: Secondary | ICD-10-CM | POA: Insufficient documentation
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
## 2018-09-16 ENCOUNTER — Emergency Department: Payer: Self-pay

## 2018-09-16 ENCOUNTER — Emergency Department
Admission: EM | Admit: 2018-09-16 | Discharge: 2018-09-16 | Disposition: A | Discharge status: Home / Self Care | Payer: Self-pay | Attending: Emergency Medicine | Admitting: Emergency Medicine

## 2018-09-16 DIAGNOSIS — S060X0A Concussion without loss of consciousness, initial encounter: Secondary | ICD-10-CM

## 2018-09-16 DIAGNOSIS — R55 Syncope and collapse: Secondary | ICD-10-CM

## 2018-09-16 LAB — COMPREHENSIVE METABOLIC PANEL
ALT: 10 U/L (ref 0–44)
AST: 14 U/L — ABNORMAL LOW (ref 15–41)
Albumin: 4.5 g/dL (ref 3.5–5.0)
Alkaline Phosphatase: 54 U/L (ref 38–126)
Anion gap: 9 (ref 5–15)
BUN: 11 mg/dL (ref 6–20)
CO2: 23 mmol/L (ref 22–32)
Calcium: 9.3 mg/dL (ref 8.9–10.3)
Chloride: 106 mmol/L (ref 98–111)
Creatinine, Ser: 0.67 mg/dL (ref 0.44–1.00)
GFR calc Af Amer: >60 mL/min (ref >60)
GFR calc non Af Amer: >60 mL/min (ref >60)
Glucose, Bld: 103 mg/dL — ABNORMAL HIGH (ref 70–99)
Potassium: 3.6 mmol/L (ref 3.5–5.1)
Sodium: 138 mmol/L (ref 135–145)
Total Bilirubin: 1.1 mg/dL (ref 0.3–1.2)
Total Protein: 8.2 g/dL — ABNORMAL HIGH (ref 6.5–8.1)

## 2018-09-16 LAB — URINALYSIS, ROUTINE W REFLEX MICROSCOPIC
Bacteria, UA: NONE SEEN
Bilirubin Urine: NEGATIVE
Glucose, UA: NEGATIVE mg/dL
Ketones, ur: NEGATIVE mg/dL
Leukocytes,Ua: NEGATIVE
Nitrite: NEGATIVE
Protein, ur: NEGATIVE mg/dL
Specific Gravity, Urine: 1.02 (ref 1.005–1.030)
pH: 6 (ref 5.0–8.0)

## 2018-09-16 LAB — CBC
HCT: 42.8 % (ref 36.0–46.0)
Hemoglobin: 14 g/dL (ref 12.0–15.0)
MCH: 29.5 pg (ref 26.0–34.0)
MCHC: 32.7 g/dL (ref 30.0–36.0)
MCV: 90.3 fL (ref 80.0–100.0)
Platelets: 245 10*3/uL (ref 150–400)
RBC: 4.74 MIL/uL (ref 3.87–5.11)
RDW: 13.2 % (ref 11.5–15.5)
WBC: 9.1 10*3/uL (ref 4.0–10.5)
nRBC: 0 % (ref 0.0–0.2)

## 2018-09-16 LAB — POCT PREGNANCY, URINE: Preg Test, Ur: NEGATIVE

## 2018-09-16 LAB — TROPONIN I: Troponin I: <0.03 ng/mL (ref <0.03)

## 2018-09-16 MED ORDER — SODIUM CHLORIDE 0.9 % IV BOLUS
1000.0000 mL | Freq: Once | INTRAVENOUS | Status: AC
  Administered 2018-09-16: 1000 mL via INTRAVENOUS

## 2018-09-16 MED ORDER — KETOROLAC TROMETHAMINE 30 MG/ML IJ SOLN
15.0000 mg | Freq: Once | INTRAMUSCULAR | Status: AC
  Administered 2018-09-16: 15 mg via INTRAVENOUS
  Filled 2018-09-16: qty 1

## 2018-09-16 MED ORDER — ONDANSETRON HCL 4 MG/2ML IJ SOLN
4.0000 mg | Freq: Once | INTRAMUSCULAR | Status: AC
  Administered 2018-09-16: 4 mg via INTRAVENOUS
  Filled 2018-09-16: qty 2
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
## 2018-09-16 NOTE — ED Notes (Signed)
EDP in with patient 

## 2018-10-05 ENCOUNTER — Other Ambulatory Visit: Payer: Self-pay

## 2018-10-05 ENCOUNTER — Ambulatory Visit
Admission: EM | Admit: 2018-10-05 | Discharge: 2018-10-05 | Disposition: A | Discharge status: Home / Self Care | Payer: MEDICAID | Attending: Family Medicine | Admitting: Family Medicine

## 2018-10-05 DIAGNOSIS — Z202 Contact with and (suspected) exposure to infections with a predominantly sexual mode of transmission: Secondary | ICD-10-CM

## 2018-10-05 DIAGNOSIS — N760 Acute vaginitis: Secondary | ICD-10-CM

## 2018-10-05 DIAGNOSIS — B9689 Other specified bacterial agents as the cause of diseases classified elsewhere: Secondary | ICD-10-CM

## 2018-10-05 DIAGNOSIS — Z3202 Encounter for pregnancy test, result negative: Secondary | ICD-10-CM

## 2018-10-05 DIAGNOSIS — N898 Other specified noninflammatory disorders of vagina: Secondary | ICD-10-CM

## 2018-10-05 LAB — WET PREP, GENITAL
Sperm: NONE SEEN
Trich, Wet Prep: NONE SEEN
Yeast Wet Prep HPF POC: NONE SEEN

## 2018-10-05 LAB — URINALYSIS, COMPLETE (UACMP) WITH MICROSCOPIC
Bilirubin Urine: NEGATIVE
Glucose, UA: NEGATIVE mg/dL
Hgb urine dipstick: NEGATIVE
Leukocytes,Ua: NEGATIVE
Nitrite: NEGATIVE
Protein, ur: NEGATIVE mg/dL
Specific Gravity, Urine: >1.03 — ABNORMAL HIGH (ref 1.005–1.030)
pH: 6.5 (ref 5.0–8.0)

## 2018-10-05 LAB — CHLAMYDIA/NGC RT PCR (ARMC ONLY): N gonorrhoeae: NOT DETECTED

## 2018-10-05 LAB — CHLAMYDIA/NGC RT PCR (ARMC ONLY)??????????: Chlamydia Tr: NOT DETECTED

## 2018-10-05 LAB — PREGNANCY, URINE: Preg Test, Ur: NEGATIVE

## 2018-10-05 MED ORDER — METRONIDAZOLE 500 MG PO TABS: 500.0000 mg | ORAL_TABLET | Freq: Two times a day (BID) | ORAL | 0 refills | Status: AC
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
## 2018-10-05 NOTE — Discharge Instructions (Addendum)
It was very nice seeing you today in clinic. Thank you for entrusting me with your care.   Labs obtained today in clinic to test for STDs. We will have someone call you with the results.   What has resulted so far was positive for BV. I have given you some information on that. You cannot drink alcohol with the medication used to treat this as it will make you very sick.   Make arrangements to follow up with your regular doctor in 1 week for re-evaluation if not improving. If your symptoms/condition worsens, please seek follow up care either here or in the ER. Please remember, our [place] providers are "right here with you" when you need [place].   Again, it was my pleasure to take care of you today. Thank you for choosing our clinic. I hope that you start to feel better quickly.   Honor Loh, MSN, APRN, FNP-C, CEN Advanced Practice Provider [facility]

## 2018-10-06 LAB — HIV ANTIBODY (ROUTINE TESTING W REFLEX): HIV Screen 4th Generation wRfx: NONREACTIVE

## 2018-10-06 LAB — RPR: RPR Ser Ql: NONREACTIVE

## 2018-10-07 LAB — HEPATITIS PANEL, ACUTE
HCV Ab: 0.2 s/co ratio (ref 0.0–0.9)
Hep A IgM: NEGATIVE
Hep B C IgM: NEGATIVE
Hepatitis B Surface Ag: NEGATIVE

## 2021-01-18 IMAGING — CT CT HEAD WITHOUT CONTRAST
4 of 5 series · 15 of 47 positions shown, 17 images · non-contrast
Comparison: None.

CLINICAL DATA: Headache

EXAM:
CT HEAD WITHOUT CONTRAST
TECHNIQUE: Contiguous axial images were obtained from the base of the skull
through the vertex without intravenous contrast.

[Series 2: head wo · axial · 0.41mm/px · z∈[-68,+12]mm · 4 of 28 slices shown]
[im 6/28  brain]
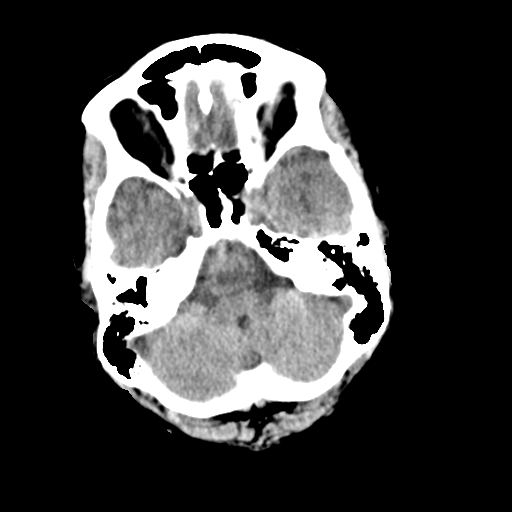
[im 11/28  brain]
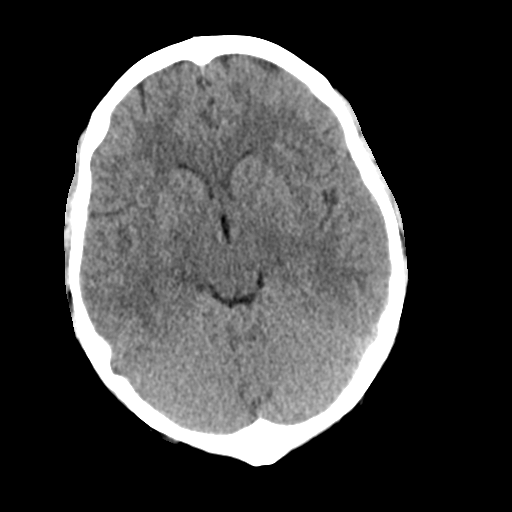
[im 17/28  brain]
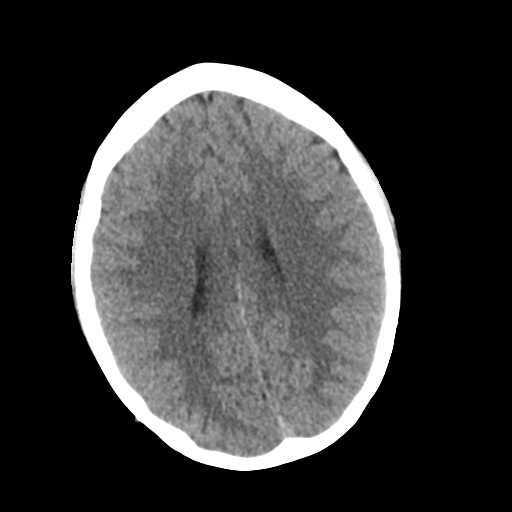
[im 22/28  brain]
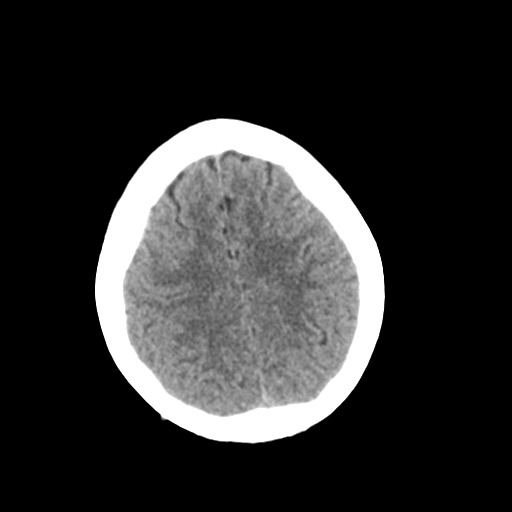

[Series 4: head wo recon · axial · 0.32mm/px · z∈[-117,-18]mm · 5 of 32 slices shown, 7 images]
[im 6/32  brain]
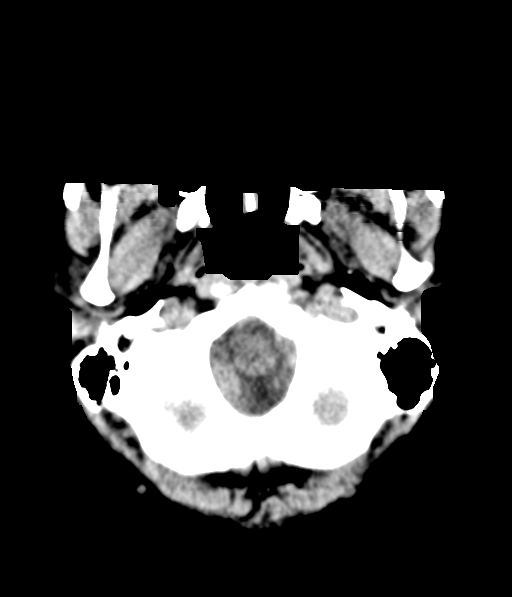
[im 6/32  bone]
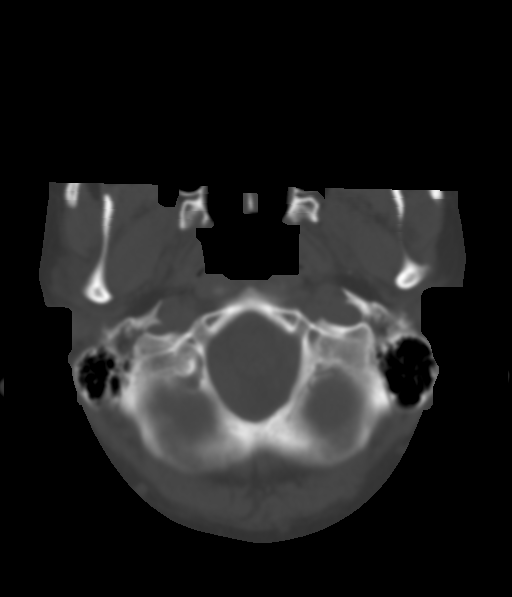
[im 11/32  brain]
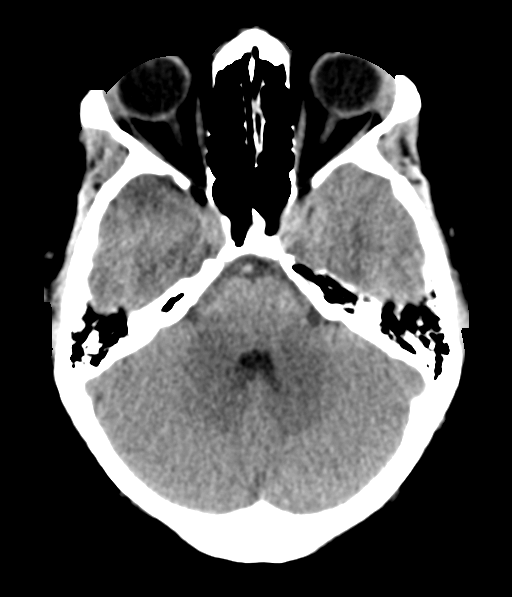
[im 16/32  brain]
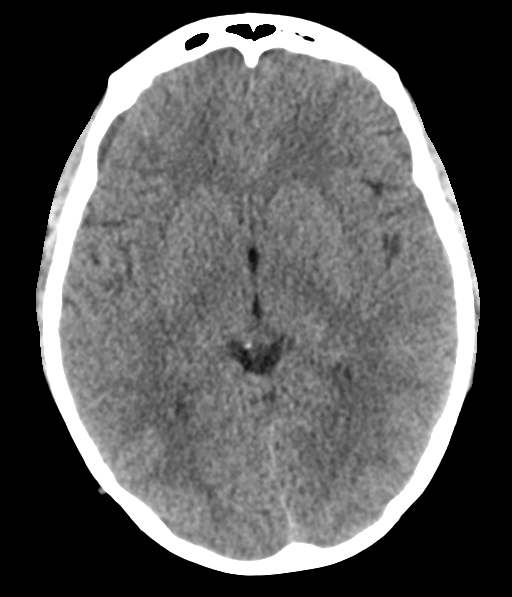
[im 21/32  brain]
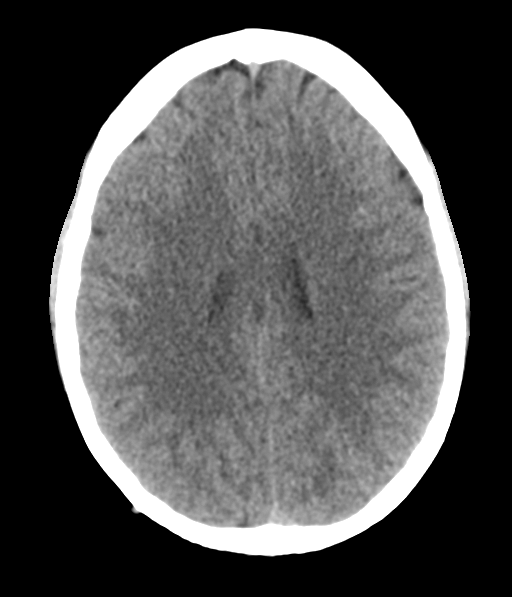
[im 26/32  brain]
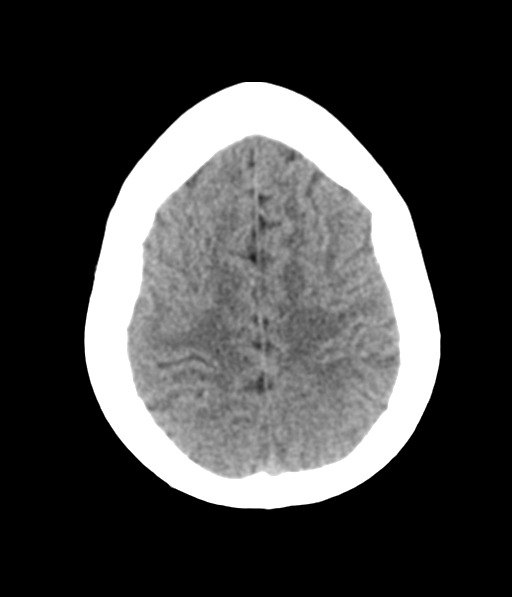
[im 26/32  bone]
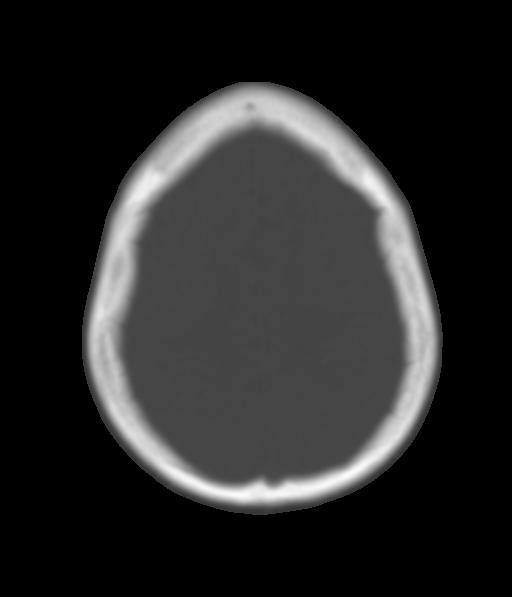

[Series 6: coronal soft tissue · coronal · 0.30mm/px · 3 of 67 slices shown]
[im 23/67  brain]
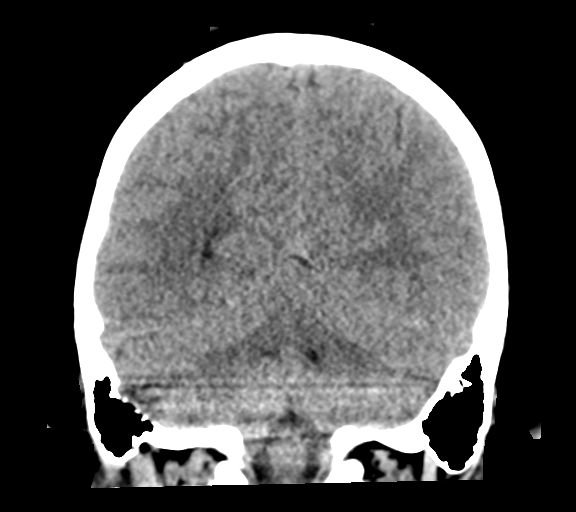
[im 30/67  brain]
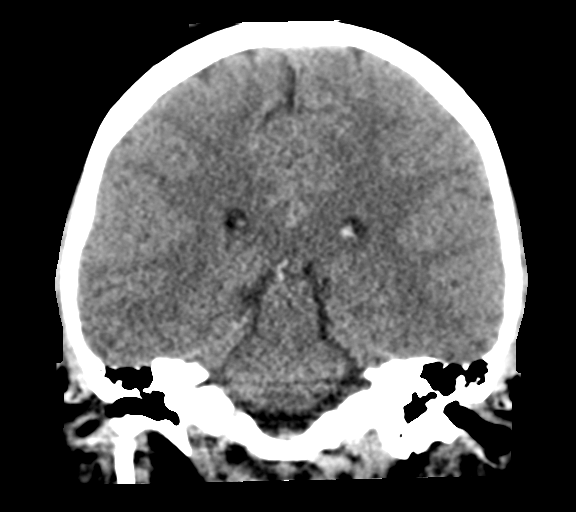
[im 37/67  brain]
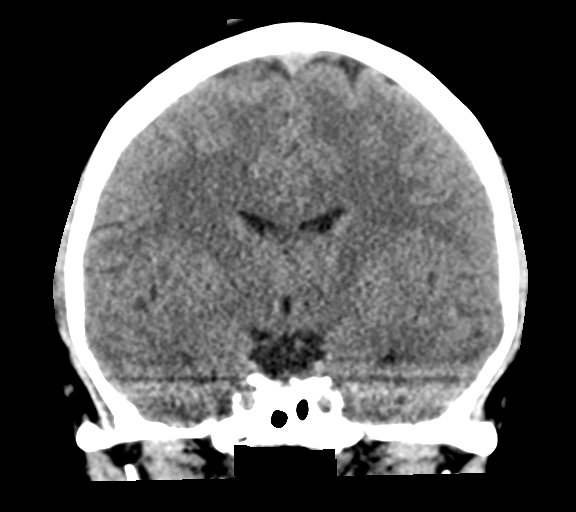

[Series 7: sagittal soft tissue · sagittal · 0.31mm/px · 3 of 67 slices shown]
[im 23/67  brain]
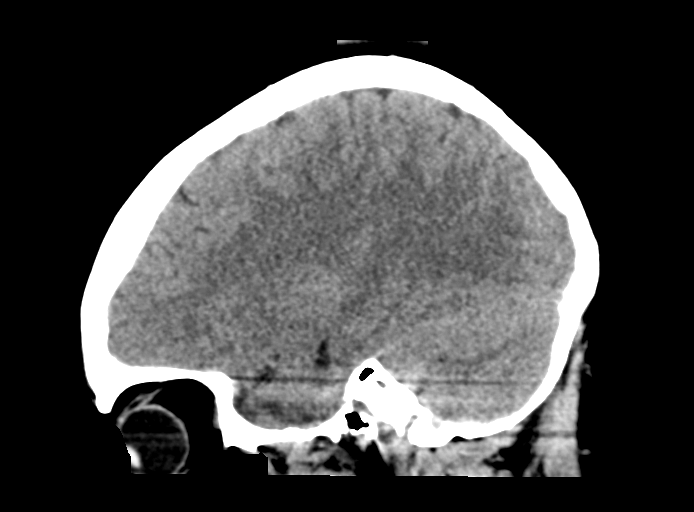
[im 34/67  brain]
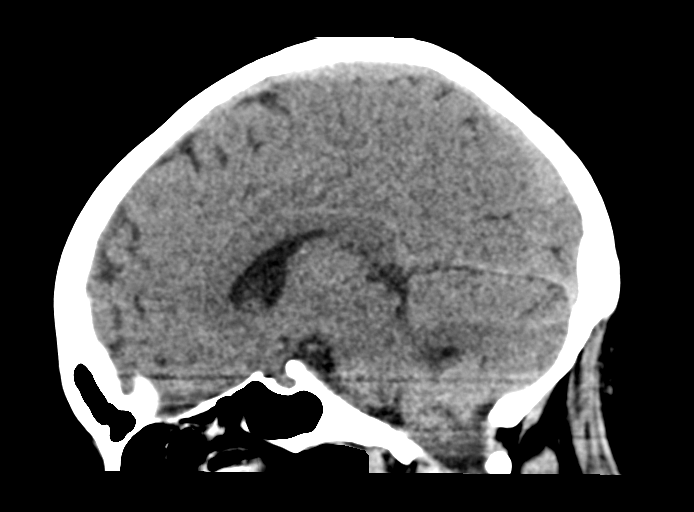
[im 45/67  brain]
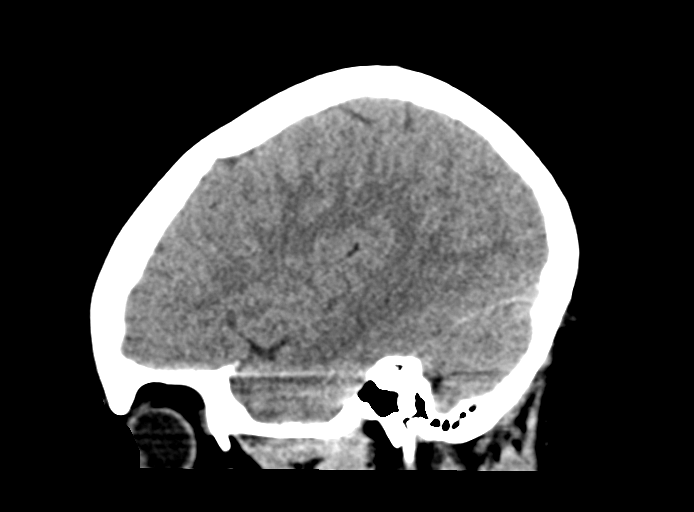

[15 of 47 positions shown; findings below may reference images not displayed]

FINDINGS: Brain: No acute intracranial abnormality. Specifically, no
hemorrhage, hydrocephalus, mass lesion, acute infarction, or
significant intracranial injury.

Vascular: No hyperdense vessel or unexpected calcification.

Skull: No acute calvarial abnormality.

Sinuses/Orbits: Visualized paranasal sinuses and mastoids clear.
Orbital soft tissues unremarkable.

Other: None
IMPRESSION: Normal study.
